# Patient Record
Sex: Female | Born: 1960 | Race: Black or African American | Hispanic: No | Marital: Single | Smoking: Former smoker
Health system: Southern US, Community
[De-identification: ages and names within clinical notes are randomized; demographics above are authoritative.]

## PROBLEM LIST (undated history)

## (undated) DIAGNOSIS — I10 Essential (primary) hypertension: Secondary | ICD-10-CM

## (undated) DIAGNOSIS — Z972 Presence of dental prosthetic device (complete) (partial): Secondary | ICD-10-CM

## (undated) DIAGNOSIS — N6022 Fibroadenosis of left breast: Secondary | ICD-10-CM

## (undated) DIAGNOSIS — Z98811 Dental restoration status: Secondary | ICD-10-CM

## (undated) DIAGNOSIS — J302 Other seasonal allergic rhinitis: Secondary | ICD-10-CM

## (undated) DIAGNOSIS — Z8639 Personal history of other endocrine, nutritional and metabolic disease: Secondary | ICD-10-CM

## (undated) HISTORY — PX: BREAST CYST EXCISION: SHX579

## (undated) HISTORY — PX: HYSTEROSCOPY WITH D & C: SHX1775

---

## 2000-01-07 ENCOUNTER — Ambulatory Visit: Admission: RE | Admit: 2000-01-07 | Discharge: 2000-01-07 | Payer: Self-pay | Admitting: Family Medicine

## 2000-01-24 ENCOUNTER — Encounter: Payer: Self-pay | Admitting: Family Medicine

## 2000-01-24 ENCOUNTER — Ambulatory Visit (HOSPITAL_COMMUNITY): Admission: RE | Admit: 2000-01-24 | Discharge: 2000-01-24 | Payer: Self-pay | Admitting: Family Medicine

## 2000-02-08 ENCOUNTER — Ambulatory Visit (HOSPITAL_COMMUNITY): Admission: RE | Admit: 2000-02-08 | Discharge: 2000-02-08 | Payer: Self-pay | Admitting: Cardiology

## 2000-02-08 HISTORY — PX: TRANSESOPHAGEAL ECHOCARDIOGRAM: SHX273

## 2001-07-28 ENCOUNTER — Other Ambulatory Visit: Admission: RE | Admit: 2001-07-28 | Discharge: 2001-07-28 | Payer: Self-pay | Admitting: Obstetrics and Gynecology

## 2001-08-18 ENCOUNTER — Encounter: Payer: Self-pay | Admitting: Obstetrics and Gynecology

## 2001-08-18 ENCOUNTER — Ambulatory Visit (HOSPITAL_COMMUNITY): Admission: RE | Admit: 2001-08-18 | Discharge: 2001-08-18 | Payer: Self-pay | Admitting: Obstetrics and Gynecology

## 2002-05-25 ENCOUNTER — Ambulatory Visit (HOSPITAL_COMMUNITY): Admission: RE | Admit: 2002-05-25 | Discharge: 2002-05-25 | Payer: Self-pay | Admitting: Endocrinology

## 2002-05-25 ENCOUNTER — Encounter: Payer: Self-pay | Admitting: Endocrinology

## 2002-08-02 ENCOUNTER — Other Ambulatory Visit: Admission: RE | Admit: 2002-08-02 | Discharge: 2002-08-02 | Payer: Self-pay | Admitting: Obstetrics and Gynecology

## 2002-08-19 ENCOUNTER — Encounter: Payer: Self-pay | Admitting: Obstetrics and Gynecology

## 2002-08-19 ENCOUNTER — Ambulatory Visit (HOSPITAL_COMMUNITY): Admission: RE | Admit: 2002-08-19 | Discharge: 2002-08-19 | Payer: Self-pay | Admitting: Obstetrics and Gynecology

## 2003-07-25 ENCOUNTER — Other Ambulatory Visit: Admission: RE | Admit: 2003-07-25 | Discharge: 2003-07-25 | Payer: Self-pay | Admitting: Obstetrics and Gynecology

## 2003-08-22 ENCOUNTER — Ambulatory Visit (HOSPITAL_COMMUNITY): Admission: RE | Admit: 2003-08-22 | Discharge: 2003-08-22 | Payer: Self-pay | Admitting: Obstetrics and Gynecology

## 2004-08-14 ENCOUNTER — Other Ambulatory Visit: Admission: RE | Admit: 2004-08-14 | Discharge: 2004-08-14 | Payer: Self-pay | Admitting: Obstetrics and Gynecology

## 2004-08-30 ENCOUNTER — Ambulatory Visit (HOSPITAL_COMMUNITY): Admission: RE | Admit: 2004-08-30 | Discharge: 2004-08-30 | Payer: Self-pay | Admitting: Obstetrics and Gynecology

## 2005-08-15 ENCOUNTER — Other Ambulatory Visit: Admission: RE | Admit: 2005-08-15 | Discharge: 2005-08-15 | Payer: Self-pay | Admitting: Obstetrics and Gynecology

## 2005-09-12 ENCOUNTER — Ambulatory Visit (HOSPITAL_COMMUNITY): Admission: RE | Admit: 2005-09-12 | Discharge: 2005-09-12 | Payer: Self-pay | Admitting: Obstetrics and Gynecology

## 2006-09-15 ENCOUNTER — Ambulatory Visit (HOSPITAL_COMMUNITY): Admission: RE | Admit: 2006-09-15 | Discharge: 2006-09-15 | Payer: Self-pay | Admitting: Obstetrics and Gynecology

## 2007-09-21 ENCOUNTER — Ambulatory Visit (HOSPITAL_COMMUNITY): Admission: RE | Admit: 2007-09-21 | Discharge: 2007-09-21 | Payer: Self-pay | Admitting: Obstetrics and Gynecology

## 2008-10-14 ENCOUNTER — Ambulatory Visit (HOSPITAL_COMMUNITY): Admission: RE | Admit: 2008-10-14 | Discharge: 2008-10-14 | Payer: Self-pay | Admitting: Obstetrics and Gynecology

## 2009-10-04 ENCOUNTER — Encounter: Admission: RE | Admit: 2009-10-04 | Discharge: 2009-10-04 | Payer: Self-pay | Admitting: Obstetrics and Gynecology

## 2010-10-04 ENCOUNTER — Other Ambulatory Visit: Payer: Self-pay | Admitting: Obstetrics and Gynecology

## 2010-10-04 DIAGNOSIS — D259 Leiomyoma of uterus, unspecified: Secondary | ICD-10-CM

## 2010-10-16 ENCOUNTER — Ambulatory Visit
Admission: RE | Admit: 2010-10-16 | Discharge: 2010-10-16 | Disposition: A | Discharge status: Home / Self Care | Payer: Managed Care, Other (non HMO) | Source: Ambulatory Visit | Attending: Obstetrics and Gynecology | Admitting: Obstetrics and Gynecology

## 2010-10-16 ENCOUNTER — Other Ambulatory Visit: Payer: Self-pay | Admitting: Obstetrics and Gynecology

## 2010-10-16 VITALS — BP 143/81 | HR 96 | Temp 98.5°F | Resp 14 | Ht 65.0 in | Wt 157.0 lb

## 2010-10-16 DIAGNOSIS — D219 Benign neoplasm of connective and other soft tissue, unspecified: Secondary | ICD-10-CM

## 2010-10-16 DIAGNOSIS — D259 Leiomyoma of uterus, unspecified: Secondary | ICD-10-CM

## 2010-10-16 HISTORY — DX: Essential (primary) hypertension: I10

## 2010-10-17 DIAGNOSIS — E0500 Thyrotoxicosis with diffuse goiter without thyrotoxic crisis or storm: Secondary | ICD-10-CM | POA: Insufficient documentation

## 2010-11-09 ENCOUNTER — Other Ambulatory Visit (HOSPITAL_COMMUNITY): Payer: Self-pay | Admitting: Obstetrics and Gynecology

## 2010-11-09 DIAGNOSIS — Z1231 Encounter for screening mammogram for malignant neoplasm of breast: Secondary | ICD-10-CM

## 2010-11-15 ENCOUNTER — Other Ambulatory Visit: Payer: Self-pay | Admitting: Obstetrics and Gynecology

## 2010-11-15 DIAGNOSIS — D219 Benign neoplasm of connective and other soft tissue, unspecified: Secondary | ICD-10-CM

## 2010-11-19 ENCOUNTER — Ambulatory Visit (HOSPITAL_COMMUNITY)
Admission: RE | Admit: 2010-11-19 | Discharge: 2010-11-19 | Disposition: A | Discharge status: Home / Self Care | Payer: Managed Care, Other (non HMO) | Source: Ambulatory Visit | Attending: Obstetrics and Gynecology | Admitting: Obstetrics and Gynecology

## 2010-11-19 DIAGNOSIS — Z1231 Encounter for screening mammogram for malignant neoplasm of breast: Secondary | ICD-10-CM

## 2010-11-20 ENCOUNTER — Ambulatory Visit
Admission: RE | Admit: 2010-11-20 | Discharge: 2010-11-20 | Disposition: A | Discharge status: Home / Self Care | Payer: Managed Care, Other (non HMO) | Source: Ambulatory Visit | Attending: Obstetrics and Gynecology | Admitting: Obstetrics and Gynecology

## 2010-11-20 DIAGNOSIS — D219 Benign neoplasm of connective and other soft tissue, unspecified: Secondary | ICD-10-CM

## 2010-11-20 MED ORDER — GADOBENATE DIMEGLUMINE 529 MG/ML IV SOLN
14.0000 mL | Freq: Once | INTRAVENOUS | Status: AC | PRN
  Administered 2010-11-20: 14 mL via INTRAVENOUS

## 2011-01-18 NOTE — Op Note (Signed)
[facility]  Patient:    Emma Velasquez, Emma Velasquez                         MRN: [id number] Proc. Date: 02/08/00 Adm. Date:  [id number] Disc. Date: [id number] Attending:  Corliss Marcus CC:         Dario Guardian, M.D.             Echocardiography laboratory             Fritzi Mandes, M.D.                           Operative Report  PROCEDURE:  Transesophageal echocardiography.  REASON FOR STUDY:  Ms. Emma Velasquez is a 50 year old woman who was referred to my  office for evaluation of heart murmur and transthoracic echocardiographic evidence of possible atrioseptal defect.  She did have physical examination findings to suggest right ventricular overload.  However, she was also found to be hyperthyroid by subsequent chemical testing.  She is now on Toprol XL 100 mg p.o. q.d. and awaits her evaluation by Fritzi Mandes, M.D. and subsequent treatment. She is therefore brought to the endoscopy suite for transesophageal echocardiography to confirm the presence of a significant ASD.  DESCRIPTION OF PROCEDURE:  Following topical pharyngeal anesthesia using 20% Benzocaine and the IV administration of a total of 4 mg of Versed and 75 mcg of  Fentanyl, the probe was introduced atraumatically.  Excellent transesophageal images were obtained.  Heart rate, blood pressure, O2 saturation, and ECG were monitored throughout and remained stable.  FINDINGS:  Essentially the study is normal.  All four chamber sizes are normal.  There is normal morphology of all four cardiac valves which were well visualized. There is mild mitral reguritation by color flow Doppler.  Left and right ventricular systolic function is normal.  The interatrial septum appears intact by 2-D imaging.  There was no shunt flow eft to right by color flow analysis.  An agitated saline intravenous contrast study was obtained and showed good opacification of the right heart chambers.  There was no  evidence of right to left shunt.  There was no negative contrast in the right atrium from the septum.  The aorta is normal size and without disease.  FINAL IMPRESSION: 1. Normal transesophageal echocardiogram. 2. No evidence of interatrial septal defect - PFO - shunt.  PLAN:  The patient is presented with this gratifying news.  She will remain on eta blocker while Fritzi Mandes, M.D. is undertaking her treatment and treatment for hyperthyroidism.  We will see her in the office again in two to four weeks. DD:  02/08/00 TD:  02/12/00 Job: [id number]

## 2011-02-13 ENCOUNTER — Other Ambulatory Visit (HOSPITAL_COMMUNITY): Payer: Self-pay | Admitting: Interventional Radiology

## 2011-02-13 DIAGNOSIS — D219 Benign neoplasm of connective and other soft tissue, unspecified: Secondary | ICD-10-CM

## 2011-03-11 ENCOUNTER — Ambulatory Visit (HOSPITAL_COMMUNITY)
Admission: RE | Admit: 2011-03-11 | Discharge: 2011-03-11 | Disposition: A | Discharge status: Home / Self Care | Payer: Managed Care, Other (non HMO) | Source: Ambulatory Visit | Attending: Interventional Radiology | Admitting: Interventional Radiology

## 2011-03-11 DIAGNOSIS — D259 Leiomyoma of uterus, unspecified: Secondary | ICD-10-CM | POA: Insufficient documentation

## 2011-03-11 LAB — CBC
MCHC: 32.4 g/dL (ref 30.0–36.0)
MCV: 78.9 fL (ref 78.0–100.0)
Platelets: 283 10*3/uL (ref 150–400)
RBC: 5.12 MIL/uL — ABNORMAL HIGH (ref 3.87–5.11)
RDW: 13.6 % (ref 11.5–15.5)
WBC: 5.4 10*3/uL (ref 4.0–10.5)

## 2011-03-11 LAB — CREATININE, SERUM: Creatinine, Ser: 0.8 mg/dL (ref 0.50–1.10)

## 2011-03-12 ENCOUNTER — Ambulatory Visit (HOSPITAL_COMMUNITY)
Admission: RE | Admit: 2011-03-12 | Discharge: 2011-03-12 | Disposition: A | Discharge status: Home / Self Care | Payer: Managed Care, Other (non HMO) | Source: Ambulatory Visit | Attending: Interventional Radiology | Admitting: Interventional Radiology

## 2011-03-12 DIAGNOSIS — D219 Benign neoplasm of connective and other soft tissue, unspecified: Secondary | ICD-10-CM

## 2011-03-27 ENCOUNTER — Other Ambulatory Visit (HOSPITAL_COMMUNITY): Payer: Self-pay | Admitting: Interventional Radiology

## 2011-03-27 DIAGNOSIS — D219 Benign neoplasm of connective and other soft tissue, unspecified: Secondary | ICD-10-CM

## 2011-04-11 ENCOUNTER — Other Ambulatory Visit (HOSPITAL_COMMUNITY): Payer: Self-pay | Admitting: Interventional Radiology

## 2011-04-11 ENCOUNTER — Observation Stay (HOSPITAL_COMMUNITY)
Admission: RE | Admit: 2011-04-11 | Discharge: 2011-04-12 | Disposition: A | Discharge status: Home / Self Care | Payer: Managed Care, Other (non HMO) | Source: Ambulatory Visit | Attending: Interventional Radiology | Admitting: Interventional Radiology

## 2011-04-11 DIAGNOSIS — D251 Intramural leiomyoma of uterus: Secondary | ICD-10-CM

## 2011-04-11 DIAGNOSIS — N920 Excessive and frequent menstruation with regular cycle: Secondary | ICD-10-CM | POA: Insufficient documentation

## 2011-04-11 DIAGNOSIS — D259 Leiomyoma of uterus, unspecified: Principal | ICD-10-CM | POA: Insufficient documentation

## 2011-04-11 DIAGNOSIS — I10 Essential (primary) hypertension: Secondary | ICD-10-CM | POA: Insufficient documentation

## 2011-04-11 DIAGNOSIS — R112 Nausea with vomiting, unspecified: Secondary | ICD-10-CM | POA: Insufficient documentation

## 2011-04-11 DIAGNOSIS — D219 Benign neoplasm of connective and other soft tissue, unspecified: Secondary | ICD-10-CM

## 2011-04-11 LAB — BASIC METABOLIC PANEL
BUN: 12 mg/dL (ref 6–23)
Calcium: 9.9 mg/dL (ref 8.4–10.5)
Chloride: 104 mEq/L (ref 96–112)
GFR calc Af Amer: >60 mL/min (ref >60)
Potassium: 3.4 mEq/L — ABNORMAL LOW (ref 3.5–5.1)
Sodium: 138 mEq/L (ref 135–145)

## 2011-04-11 LAB — CBC
HCT: 40.3 % (ref 36.0–46.0)
MCHC: 33.5 g/dL (ref 30.0–36.0)
Platelets: 268 10*3/uL (ref 150–400)
RDW: 14 % (ref 11.5–15.5)

## 2011-04-11 LAB — HCG, SERUM, QUALITATIVE: Preg, Serum: NEGATIVE

## 2011-04-11 MED ORDER — IOHEXOL 300 MG/ML  SOLN
150.0000 mL | Freq: Once | INTRAMUSCULAR | Status: AC | PRN
  Administered 2011-04-11: 99 mL via INTRA_ARTERIAL

## 2011-04-16 ENCOUNTER — Other Ambulatory Visit: Payer: Self-pay | Admitting: Interventional Radiology

## 2011-04-16 DIAGNOSIS — Z09 Encounter for follow-up examination after completed treatment for conditions other than malignant neoplasm: Secondary | ICD-10-CM

## 2011-04-23 ENCOUNTER — Ambulatory Visit
Admission: RE | Admit: 2011-04-23 | Discharge: 2011-04-23 | Disposition: A | Discharge status: Home / Self Care | Payer: Managed Care, Other (non HMO) | Source: Ambulatory Visit | Attending: Interventional Radiology | Admitting: Interventional Radiology

## 2011-04-23 VITALS — BP 131/85 | HR 83 | Temp 98.3°F | Resp 14

## 2011-04-23 DIAGNOSIS — Z09 Encounter for follow-up examination after completed treatment for conditions other than malignant neoplasm: Secondary | ICD-10-CM

## 2011-04-23 NOTE — Progress Notes (Signed)
Pt doing well post [place].  Occasional spotting, no foul odor.  Occasional mild cramping.    Appetite improving.  Energy improving.  Next Depo injection scheduled for September at [facility] office.

## 2011-07-09 ENCOUNTER — Telehealth: Payer: Self-pay | Admitting: Radiology

## 2011-07-09 NOTE — Telephone Encounter (Signed)
3 mo f/u [place] phone call.    Pt doing well.  Dec'd breakthrough bleeding (only occasionally post [place]).  Pt states that she is doing well & prefers to wait for 6 mo f/u to schedule app't for follow up office visit w/ Dr Bonnielee Haff.

## 2011-09-05 ENCOUNTER — Other Ambulatory Visit: Payer: Self-pay | Admitting: Interventional Radiology

## 2011-09-05 DIAGNOSIS — D219 Benign neoplasm of connective and other soft tissue, unspecified: Secondary | ICD-10-CM

## 2011-10-09 ENCOUNTER — Ambulatory Visit
Admission: RE | Admit: 2011-10-09 | Discharge: 2011-10-09 | Disposition: A | Discharge status: Home / Self Care | Payer: Managed Care, Other (non HMO) | Source: Ambulatory Visit | Attending: Interventional Radiology | Admitting: Interventional Radiology

## 2011-10-09 ENCOUNTER — Inpatient Hospital Stay: Admission: RE | Admit: 2011-10-09 | Payer: Managed Care, Other (non HMO) | Source: Ambulatory Visit

## 2011-10-09 DIAGNOSIS — D219 Benign neoplasm of connective and other soft tissue, unspecified: Secondary | ICD-10-CM

## 2011-10-09 NOTE — Progress Notes (Signed)
Last Depo injection on 05/14/2011.    Pt reports that she had a very light menstrual cycles mid December 2012 & mid January 2013.   Denies heavy flow or clots.  States that she is doing well overall, no complaints.

## 2011-11-11 ENCOUNTER — Telehealth: Payer: Self-pay | Admitting: Emergency Medicine

## 2011-11-11 NOTE — Telephone Encounter (Signed)
 POST [place] BY DR HOSS, HAD C/C WATERY/PINKISH D/C. HAD SOME TISSUE D/C LAST WK. IS THIS NORMAL?  NO FEVER OR FOWL SMELL.  11:41AM- PAGED KEVIN, PA-C, KEVIN TO CONTACT PT.

## 2011-11-13 ENCOUNTER — Telehealth: Payer: Self-pay | Admitting: Emergency Medicine

## 2011-11-13 ENCOUNTER — Other Ambulatory Visit (HOSPITAL_COMMUNITY): Payer: Self-pay | Admitting: Interventional Radiology

## 2011-11-13 DIAGNOSIS — N898 Other specified noninflammatory disorders of vagina: Secondary | ICD-10-CM

## 2011-11-13 NOTE — Telephone Encounter (Signed)
PT D/C NOT BETTER FROM 2 DAYS AGO.  SAID KEVIN, PA-C WOULD CALL IN A RX IF NEEDED  9:59AM- PAGED KEVIN, PA-C- 1003AM- KEVIN WILL CONTACT PT.

## 2011-11-14 ENCOUNTER — Ambulatory Visit (HOSPITAL_COMMUNITY)
Admission: RE | Admit: 2011-11-14 | Discharge: 2011-11-14 | Disposition: A | Discharge status: Home / Self Care | Payer: Managed Care, Other (non HMO) | Source: Ambulatory Visit | Attending: Interventional Radiology | Admitting: Interventional Radiology

## 2011-11-14 ENCOUNTER — Other Ambulatory Visit (HOSPITAL_COMMUNITY): Payer: Self-pay | Admitting: Interventional Radiology

## 2011-11-14 DIAGNOSIS — N898 Other specified noninflammatory disorders of vagina: Secondary | ICD-10-CM | POA: Insufficient documentation

## 2011-11-14 DIAGNOSIS — D259 Leiomyoma of uterus, unspecified: Secondary | ICD-10-CM | POA: Insufficient documentation

## 2011-11-14 DIAGNOSIS — D219 Benign neoplasm of connective and other soft tissue, unspecified: Secondary | ICD-10-CM

## 2011-11-14 DIAGNOSIS — Z9889 Other specified postprocedural states: Secondary | ICD-10-CM | POA: Insufficient documentation

## 2011-11-21 ENCOUNTER — Encounter (INDEPENDENT_AMBULATORY_CARE_PROVIDER_SITE_OTHER): Payer: Managed Care, Other (non HMO) | Admitting: Obstetrics and Gynecology

## 2011-11-21 DIAGNOSIS — R319 Hematuria, unspecified: Secondary | ICD-10-CM

## 2011-11-21 DIAGNOSIS — N926 Irregular menstruation, unspecified: Secondary | ICD-10-CM

## 2011-11-21 DIAGNOSIS — N949 Unspecified condition associated with female genital organs and menstrual cycle: Secondary | ICD-10-CM

## 2011-11-21 DIAGNOSIS — E039 Hypothyroidism, unspecified: Secondary | ICD-10-CM

## 2011-12-18 ENCOUNTER — Other Ambulatory Visit: Payer: Self-pay | Admitting: Obstetrics and Gynecology

## 2011-12-18 DIAGNOSIS — Z1231 Encounter for screening mammogram for malignant neoplasm of breast: Secondary | ICD-10-CM

## 2011-12-30 ENCOUNTER — Ambulatory Visit (HOSPITAL_COMMUNITY)
Admission: RE | Admit: 2011-12-30 | Discharge: 2011-12-30 | Disposition: A | Discharge status: Home / Self Care | Payer: Managed Care, Other (non HMO) | Source: Ambulatory Visit | Attending: Obstetrics and Gynecology | Admitting: Obstetrics and Gynecology

## 2011-12-30 DIAGNOSIS — Z1231 Encounter for screening mammogram for malignant neoplasm of breast: Secondary | ICD-10-CM | POA: Insufficient documentation

## 2012-02-17 ENCOUNTER — Ambulatory Visit: Payer: Managed Care, Other (non HMO)

## 2012-02-17 ENCOUNTER — Ambulatory Visit: Payer: Managed Care, Other (non HMO) | Admitting: Family Medicine

## 2012-02-17 VITALS — BP 118/77 | HR 77 | Temp 98.1°F | Resp 12 | Ht 66.0 in | Wt 168.0 lb

## 2012-02-17 DIAGNOSIS — M79646 Pain in unspecified finger(s): Secondary | ICD-10-CM

## 2012-02-17 DIAGNOSIS — M79609 Pain in unspecified limb: Secondary | ICD-10-CM

## 2012-02-17 DIAGNOSIS — M20019 Mallet finger of unspecified finger(s): Secondary | ICD-10-CM

## 2012-02-17 NOTE — Progress Notes (Signed)
  Subjective:    Patient ID: Emma Velasquez, female    DOB: [birth year], 51 y.o.   MRN: [id number]  HPI Patient presents with 3 day history of left pinky DIP immobility. States she was pulling out a broken drawer that slipped and fell down. She is unsure whether she was holding the drawer on the top or underneath, but after it slipped her left pinky DIP has "looked funny." She denies any pain on the affected joint but admits to decreased movement. She was wearing a splint that she got from the pharmacy but stopped wearing it due to it being too bulky.     Review of Systems  All other systems reviewed and are negative.       Objective:   Physical Exam  Constitutional: She is oriented to person, place, and time. She appears well-developed and well-nourished.  HENT:  Head: Normocephalic and atraumatic.  Right Ear: External ear normal.  Left Ear: External ear normal.  Eyes: Conjunctivae are normal.  Neck: Normal range of motion.  Cardiovascular: Normal rate, regular rhythm and normal heart sounds.   Pulmonary/Chest: Effort normal and breath sounds normal.  Abdominal: Soft. Bowel sounds are normal.  Musculoskeletal:       Left 5th DIP is in position of flexion. Patient unable to passively extend. No pain to palpation of the joint. 5/5 strength of flexor. Capillary refill <2 seconds. Unable to resist extension.   Neurological: She is alert and oriented to person, place, and time.  Psychiatric: She has a normal mood and affect. Her behavior is normal. Judgment and thought content normal.     [facility] reading (PRIMARY) by  Dr. Neva Seat as normal with no acute bony abnormalities.       Assessment & Plan:    1. Finger pain  DG Finger Little Left  2. Mallet finger  Left DIP splinted in position of extension. Discussed the need for continuous splinting x 6 weeks.  Refer to [facility] for evaluation and management of mallet finger.  Ambulatory referral to Physical Therapy

## 2012-02-18 NOTE — Progress Notes (Signed)
  Subjective:    Patient ID: Emma Velasquez, female    DOB: 12-10-1960, 51 y.o.   MRN: [id number]  HPI    Review of Systems     Objective:   Physical Exam        Assessment & Plan:  Xray read and patient discussed with Heather Marte,PA-C. Patient with inability to actively extend distal phalynx - Mallet finger.  [facility] for splinting protocol and formed splint. Agree with assessment and plan of care per Heather's note.

## 2013-03-11 ENCOUNTER — Other Ambulatory Visit: Payer: Self-pay | Admitting: Obstetrics and Gynecology

## 2013-03-11 DIAGNOSIS — Z1231 Encounter for screening mammogram for malignant neoplasm of breast: Secondary | ICD-10-CM

## 2013-03-16 ENCOUNTER — Ambulatory Visit (HOSPITAL_COMMUNITY)
Admission: RE | Admit: 2013-03-16 | Discharge: 2013-03-16 | Disposition: A | Discharge status: Home / Self Care | Payer: Managed Care, Other (non HMO) | Source: Ambulatory Visit | Attending: Obstetrics and Gynecology | Admitting: Obstetrics and Gynecology

## 2013-03-16 DIAGNOSIS — Z1231 Encounter for screening mammogram for malignant neoplasm of breast: Secondary | ICD-10-CM | POA: Insufficient documentation

## 2013-03-18 ENCOUNTER — Other Ambulatory Visit: Payer: Self-pay | Admitting: Obstetrics and Gynecology

## 2013-03-18 DIAGNOSIS — R928 Other abnormal and inconclusive findings on diagnostic imaging of breast: Secondary | ICD-10-CM

## 2013-03-31 ENCOUNTER — Ambulatory Visit
Admission: RE | Admit: 2013-03-31 | Discharge: 2013-03-31 | Disposition: A | Discharge status: Home / Self Care | Payer: Managed Care, Other (non HMO) | Source: Ambulatory Visit | Attending: Obstetrics and Gynecology | Admitting: Obstetrics and Gynecology

## 2013-03-31 DIAGNOSIS — R928 Other abnormal and inconclusive findings on diagnostic imaging of breast: Secondary | ICD-10-CM

## 2014-04-28 ENCOUNTER — Other Ambulatory Visit: Payer: Self-pay | Admitting: Obstetrics and Gynecology

## 2014-04-28 DIAGNOSIS — Z1231 Encounter for screening mammogram for malignant neoplasm of breast: Secondary | ICD-10-CM

## 2014-05-20 ENCOUNTER — Ambulatory Visit (HOSPITAL_COMMUNITY)
Admission: RE | Admit: 2014-05-20 | Discharge: 2014-05-20 | Disposition: A | Discharge status: Home / Self Care | Payer: Medicare HMO | Source: Ambulatory Visit | Attending: Obstetrics and Gynecology | Admitting: Obstetrics and Gynecology

## 2014-05-20 ENCOUNTER — Ambulatory Visit (HOSPITAL_COMMUNITY): Payer: Managed Care, Other (non HMO)

## 2014-05-20 DIAGNOSIS — Z1231 Encounter for screening mammogram for malignant neoplasm of breast: Secondary | ICD-10-CM | POA: Insufficient documentation

## 2015-06-05 ENCOUNTER — Other Ambulatory Visit: Payer: Self-pay

## 2015-06-05 DIAGNOSIS — Z1231 Encounter for screening mammogram for malignant neoplasm of breast: Secondary | ICD-10-CM

## 2015-06-06 ENCOUNTER — Ambulatory Visit
Admission: RE | Admit: 2015-06-06 | Discharge: 2015-06-06 | Disposition: A | Discharge status: Home / Self Care | Payer: Managed Care, Other (non HMO) | Source: Ambulatory Visit

## 2015-06-06 DIAGNOSIS — Z1231 Encounter for screening mammogram for malignant neoplasm of breast: Secondary | ICD-10-CM

## 2016-06-06 ENCOUNTER — Other Ambulatory Visit: Payer: Self-pay | Admitting: Obstetrics and Gynecology

## 2016-06-06 DIAGNOSIS — Z1231 Encounter for screening mammogram for malignant neoplasm of breast: Secondary | ICD-10-CM

## 2016-06-12 ENCOUNTER — Ambulatory Visit
Admission: RE | Admit: 2016-06-12 | Discharge: 2016-06-12 | Disposition: A | Discharge status: Home / Self Care | Payer: Managed Care, Other (non HMO) | Source: Ambulatory Visit | Attending: Obstetrics and Gynecology | Admitting: Obstetrics and Gynecology

## 2016-06-12 DIAGNOSIS — Z1231 Encounter for screening mammogram for malignant neoplasm of breast: Secondary | ICD-10-CM

## 2017-09-02 HISTORY — PX: BREAST EXCISIONAL BIOPSY: SUR124

## 2018-01-14 ENCOUNTER — Other Ambulatory Visit: Payer: Self-pay | Admitting: Obstetrics and Gynecology

## 2018-01-14 DIAGNOSIS — Z1231 Encounter for screening mammogram for malignant neoplasm of breast: Secondary | ICD-10-CM

## 2018-02-04 ENCOUNTER — Ambulatory Visit
Admission: RE | Admit: 2018-02-04 | Discharge: 2018-02-04 | Disposition: A | Discharge status: Home / Self Care | Payer: 59 | Source: Ambulatory Visit | Attending: Obstetrics and Gynecology | Admitting: Obstetrics and Gynecology

## 2018-02-04 DIAGNOSIS — Z1231 Encounter for screening mammogram for malignant neoplasm of breast: Secondary | ICD-10-CM

## 2018-02-05 ENCOUNTER — Other Ambulatory Visit: Payer: Self-pay | Admitting: Obstetrics and Gynecology

## 2018-02-05 DIAGNOSIS — R928 Other abnormal and inconclusive findings on diagnostic imaging of breast: Secondary | ICD-10-CM

## 2018-02-09 ENCOUNTER — Other Ambulatory Visit: Payer: 59

## 2018-02-10 ENCOUNTER — Ambulatory Visit
Admission: RE | Admit: 2018-02-10 | Discharge: 2018-02-10 | Disposition: A | Discharge status: Home / Self Care | Payer: 59 | Source: Ambulatory Visit | Attending: Obstetrics and Gynecology | Admitting: Obstetrics and Gynecology

## 2018-02-10 ENCOUNTER — Other Ambulatory Visit: Payer: Self-pay | Admitting: Obstetrics and Gynecology

## 2018-02-10 DIAGNOSIS — R928 Other abnormal and inconclusive findings on diagnostic imaging of breast: Secondary | ICD-10-CM

## 2018-02-10 DIAGNOSIS — N6489 Other specified disorders of breast: Secondary | ICD-10-CM

## 2018-02-11 ENCOUNTER — Other Ambulatory Visit: Payer: Self-pay | Admitting: Obstetrics and Gynecology

## 2018-02-11 ENCOUNTER — Ambulatory Visit
Admission: RE | Admit: 2018-02-11 | Discharge: 2018-02-11 | Disposition: A | Discharge status: Home / Self Care | Payer: 59 | Source: Ambulatory Visit | Attending: Obstetrics and Gynecology | Admitting: Obstetrics and Gynecology

## 2018-02-11 DIAGNOSIS — N6489 Other specified disorders of breast: Secondary | ICD-10-CM

## 2018-03-09 ENCOUNTER — Other Ambulatory Visit: Payer: Self-pay | Admitting: General Surgery

## 2018-03-09 ENCOUNTER — Ambulatory Visit: Payer: Self-pay | Admitting: General Surgery

## 2018-03-09 DIAGNOSIS — N6022 Fibroadenosis of left breast: Secondary | ICD-10-CM

## 2018-04-02 DIAGNOSIS — N6022 Fibroadenosis of left breast: Secondary | ICD-10-CM

## 2018-04-02 HISTORY — DX: Fibroadenosis of left breast: N60.22

## 2018-04-09 ENCOUNTER — Encounter (HOSPITAL_BASED_OUTPATIENT_CLINIC_OR_DEPARTMENT_OTHER): Payer: Self-pay | Admitting: *Deleted

## 2018-04-09 ENCOUNTER — Other Ambulatory Visit: Payer: Self-pay

## 2018-04-09 NOTE — Pre-Procedure Instructions (Signed)
To come for BMET and EKG; to pick up Ensure pre-surgery drink 10 oz. - to drink by 0500 DOS.

## 2018-04-13 ENCOUNTER — Encounter (HOSPITAL_BASED_OUTPATIENT_CLINIC_OR_DEPARTMENT_OTHER)
Admission: RE | Admit: 2018-04-13 | Discharge: 2018-04-13 | Disposition: A | Discharge status: Home / Self Care | Payer: 59 | Source: Ambulatory Visit | Attending: General Surgery | Admitting: General Surgery

## 2018-04-13 DIAGNOSIS — Z87891 Personal history of nicotine dependence: Secondary | ICD-10-CM | POA: Diagnosis not present

## 2018-04-13 DIAGNOSIS — N6022 Fibroadenosis of left breast: Secondary | ICD-10-CM | POA: Diagnosis not present

## 2018-04-13 DIAGNOSIS — E7800 Pure hypercholesterolemia, unspecified: Secondary | ICD-10-CM | POA: Diagnosis not present

## 2018-04-13 DIAGNOSIS — Z79899 Other long term (current) drug therapy: Secondary | ICD-10-CM | POA: Diagnosis not present

## 2018-04-13 DIAGNOSIS — E0590 Thyrotoxicosis, unspecified without thyrotoxic crisis or storm: Secondary | ICD-10-CM | POA: Diagnosis not present

## 2018-04-13 DIAGNOSIS — D242 Benign neoplasm of left breast: Secondary | ICD-10-CM | POA: Diagnosis not present

## 2018-04-13 DIAGNOSIS — I10 Essential (primary) hypertension: Secondary | ICD-10-CM | POA: Diagnosis not present

## 2018-04-13 LAB — BASIC METABOLIC PANEL
ANION GAP: 10 (ref 5–15)
BUN: 13 mg/dL (ref 6–20)
CALCIUM: 9.7 mg/dL (ref 8.9–10.3)
CO2: 27 mmol/L (ref 22–32)
Chloride: 101 mmol/L (ref 98–111)
Creatinine, Ser: 0.87 mg/dL (ref 0.44–1.00)
GFR calc Af Amer: >60 mL/min (ref >60)
GFR calc non Af Amer: >60 mL/min (ref >60)
GLUCOSE: 106 mg/dL — AB (ref 70–99)
Potassium: 4 mmol/L (ref 3.5–5.1)
Sodium: 138 mmol/L (ref 135–145)

## 2018-04-13 NOTE — Progress Notes (Addendum)
Ensure pre surgery drink given with instructions to complete by 0500 dos, pt verbalized understanding.  EKG reviewed by Dr. Nyoka Cowden, will proceed with surgery as scheduled.

## 2018-04-14 ENCOUNTER — Ambulatory Visit
Admission: RE | Admit: 2018-04-14 | Discharge: 2018-04-14 | Disposition: A | Discharge status: Home / Self Care | Payer: 59 | Source: Ambulatory Visit | Attending: General Surgery | Admitting: General Surgery

## 2018-04-14 DIAGNOSIS — N6022 Fibroadenosis of left breast: Secondary | ICD-10-CM

## 2018-04-14 NOTE — Anesthesia Preprocedure Evaluation (Addendum)
Anesthesia Evaluation    Reviewed: Allergy & Precautions, NPO status , Patient's Chart, lab work & pertinent test results  Airway Mallampati: I  TM Distance: >3 FB Neck ROM: Full    Dental no notable dental hx. (+) Teeth Intact, Dental Advisory Given   Pulmonary former smoker,    Pulmonary exam normal breath sounds clear to auscultation       Cardiovascular hypertension, Pt. on medications Normal cardiovascular exam Rhythm:Regular Rate:Normal  ECG: Sinus rhythm with 1st degree A-V block   Neuro/Psych negative neurological ROS  negative psych ROS   GI/Hepatic negative GI ROS, Neg liver ROS,   Endo/Other  Hyperthyroidism   Renal/GU negative Renal ROS     Musculoskeletal negative musculoskeletal ROS (+)   Abdominal   Peds  Hematology HLD   Anesthesia Other Findings  LEFT BREAST SCLEROSING ADENOSIS  Reproductive/Obstetrics                            Anesthesia Physical Anesthesia Plan  ASA: II  Anesthesia Plan: General   Post-op Pain Management:    Induction: Intravenous  PONV Risk Score and Plan: 3 and Ondansetron, Dexamethasone, Midazolam, Scopolamine patch - Pre-op and Treatment may vary due to age or medical condition  Airway Management Planned: LMA  Additional Equipment:   Intra-op Plan:   Post-operative Plan: Extubation in OR  Informed Consent: I have reviewed the patients History and Physical, chart, labs and discussed the procedure including the risks, benefits and alternatives for the proposed anesthesia with the patient or authorized representative who has indicated his/her understanding and acceptance.   Dental advisory given  Plan Discussed with:   Anesthesia Plan Comments:        Anesthesia Quick Evaluation

## 2018-04-15 ENCOUNTER — Encounter (HOSPITAL_BASED_OUTPATIENT_CLINIC_OR_DEPARTMENT_OTHER)
Admission: RE | Disposition: A | Discharge status: Home / Self Care | Payer: Self-pay | Source: Ambulatory Visit | Attending: General Surgery

## 2018-04-15 ENCOUNTER — Ambulatory Visit
Admission: RE | Admit: 2018-04-15 | Discharge: 2018-04-15 | Disposition: A | Discharge status: Home / Self Care | Payer: 59 | Source: Ambulatory Visit | Attending: General Surgery | Admitting: General Surgery

## 2018-04-15 ENCOUNTER — Encounter (HOSPITAL_BASED_OUTPATIENT_CLINIC_OR_DEPARTMENT_OTHER): Payer: Self-pay | Admitting: Certified Registered"

## 2018-04-15 ENCOUNTER — Ambulatory Visit (HOSPITAL_BASED_OUTPATIENT_CLINIC_OR_DEPARTMENT_OTHER)
Admission: RE | Admit: 2018-04-15 | Discharge: 2018-04-15 | Disposition: A | Discharge status: Home / Self Care | Payer: 59 | Source: Ambulatory Visit | Attending: General Surgery | Admitting: General Surgery

## 2018-04-15 ENCOUNTER — Other Ambulatory Visit: Payer: Self-pay

## 2018-04-15 ENCOUNTER — Ambulatory Visit (HOSPITAL_BASED_OUTPATIENT_CLINIC_OR_DEPARTMENT_OTHER): Payer: 59 | Admitting: Anesthesiology

## 2018-04-15 DIAGNOSIS — N6022 Fibroadenosis of left breast: Secondary | ICD-10-CM | POA: Insufficient documentation

## 2018-04-15 DIAGNOSIS — E7800 Pure hypercholesterolemia, unspecified: Secondary | ICD-10-CM | POA: Insufficient documentation

## 2018-04-15 DIAGNOSIS — D242 Benign neoplasm of left breast: Secondary | ICD-10-CM | POA: Insufficient documentation

## 2018-04-15 DIAGNOSIS — Z87891 Personal history of nicotine dependence: Secondary | ICD-10-CM | POA: Insufficient documentation

## 2018-04-15 DIAGNOSIS — E0590 Thyrotoxicosis, unspecified without thyrotoxic crisis or storm: Secondary | ICD-10-CM | POA: Insufficient documentation

## 2018-04-15 DIAGNOSIS — I10 Essential (primary) hypertension: Secondary | ICD-10-CM | POA: Insufficient documentation

## 2018-04-15 DIAGNOSIS — Z79899 Other long term (current) drug therapy: Secondary | ICD-10-CM | POA: Insufficient documentation

## 2018-04-15 HISTORY — DX: Fibroadenosis of left breast: N60.22

## 2018-04-15 HISTORY — DX: Other seasonal allergic rhinitis: J30.2

## 2018-04-15 HISTORY — DX: Presence of dental prosthetic device (complete) (partial): Z97.2

## 2018-04-15 HISTORY — DX: Dental restoration status: Z98.811

## 2018-04-15 HISTORY — PX: BREAST LUMPECTOMY WITH RADIOACTIVE SEED LOCALIZATION: SHX6424

## 2018-04-15 HISTORY — DX: Personal history of other endocrine, nutritional and metabolic disease: Z86.39

## 2018-04-15 SURGERY — BREAST LUMPECTOMY WITH RADIOACTIVE SEED LOCALIZATION
Anesthesia: General | Site: Breast | Laterality: Left

## 2018-04-15 MED ORDER — PROMETHAZINE HCL 25 MG/ML IJ SOLN: 6.2500 mg | INTRAMUSCULAR | Status: DC | PRN

## 2018-04-15 MED ORDER — HYDROCODONE-ACETAMINOPHEN 5-325 MG PO TABS: 1.0000 | ORAL_TABLET | Freq: Four times a day (QID) | ORAL | 0 refills | Status: AC | PRN

## 2018-04-15 MED ORDER — GABAPENTIN 300 MG PO CAPS
300.0000 mg | ORAL_CAPSULE | ORAL | Status: AC
  Administered 2018-04-15: 300 mg via ORAL

## 2018-04-15 MED ORDER — ONDANSETRON HCL 4 MG/2ML IJ SOLN
INTRAMUSCULAR | Status: DC | PRN
  Administered 2018-04-15: 4 mg via INTRAVENOUS

## 2018-04-15 MED ORDER — MIDAZOLAM HCL 2 MG/2ML IJ SOLN
1.0000 mg | INTRAMUSCULAR | Status: DC | PRN
  Administered 2018-04-15: 2 mg via INTRAVENOUS

## 2018-04-15 MED ORDER — MIDAZOLAM HCL 2 MG/2ML IJ SOLN
INTRAMUSCULAR | Status: AC
  Filled 2018-04-15: qty 2

## 2018-04-15 MED ORDER — HYDROMORPHONE HCL 1 MG/ML IJ SOLN: 0.2500 mg | INTRAMUSCULAR | Status: DC | PRN

## 2018-04-15 MED ORDER — OXYCODONE HCL 5 MG/5ML PO SOLN: 5.0000 mg | Freq: Once | ORAL | Status: AC | PRN

## 2018-04-15 MED ORDER — OXYCODONE HCL 5 MG PO TABS
ORAL_TABLET | ORAL | Status: AC
  Filled 2018-04-15: qty 1

## 2018-04-15 MED ORDER — GABAPENTIN 300 MG PO CAPS
ORAL_CAPSULE | ORAL | Status: AC
  Filled 2018-04-15: qty 1

## 2018-04-15 MED ORDER — CELECOXIB 200 MG PO CAPS
200.0000 mg | ORAL_CAPSULE | ORAL | Status: AC
  Administered 2018-04-15: 200 mg via ORAL

## 2018-04-15 MED ORDER — CEFAZOLIN SODIUM-DEXTROSE 2-4 GM/100ML-% IV SOLN
2.0000 g | INTRAVENOUS | Status: AC
  Administered 2018-04-15: 2 g via INTRAVENOUS

## 2018-04-15 MED ORDER — OXYCODONE HCL 5 MG PO TABS
5.0000 mg | ORAL_TABLET | Freq: Once | ORAL | Status: AC | PRN
  Administered 2018-04-15: 5 mg via ORAL

## 2018-04-15 MED ORDER — EPHEDRINE SULFATE 50 MG/ML IJ SOLN
INTRAMUSCULAR | Status: DC | PRN
  Administered 2018-04-15: 10 mg via INTRAVENOUS

## 2018-04-15 MED ORDER — FENTANYL CITRATE (PF) 100 MCG/2ML IJ SOLN
INTRAMUSCULAR | Status: AC
  Filled 2018-04-15: qty 2

## 2018-04-15 MED ORDER — SCOPOLAMINE 1 MG/3DAYS TD PT72: 1.0000 | MEDICATED_PATCH | Freq: Once | TRANSDERMAL | Status: DC | PRN

## 2018-04-15 MED ORDER — CEFAZOLIN SODIUM-DEXTROSE 2-4 GM/100ML-% IV SOLN
INTRAVENOUS | Status: AC
  Filled 2018-04-15: qty 100

## 2018-04-15 MED ORDER — PROPOFOL 10 MG/ML IV BOLUS
INTRAVENOUS | Status: DC | PRN
  Administered 2018-04-15: 120 mg via INTRAVENOUS

## 2018-04-15 MED ORDER — FENTANYL CITRATE (PF) 100 MCG/2ML IJ SOLN
50.0000 ug | INTRAMUSCULAR | Status: DC | PRN
  Administered 2018-04-15: 50 ug via INTRAVENOUS

## 2018-04-15 MED ORDER — LACTATED RINGERS IV SOLN
INTRAVENOUS | Status: DC
  Administered 2018-04-15: 08:00:00 via INTRAVENOUS

## 2018-04-15 MED ORDER — DEXAMETHASONE SODIUM PHOSPHATE 4 MG/ML IJ SOLN
INTRAMUSCULAR | Status: DC | PRN
  Administered 2018-04-15: 10 mg via INTRAVENOUS

## 2018-04-15 MED ORDER — CELECOXIB 200 MG PO CAPS
ORAL_CAPSULE | ORAL | Status: AC
  Filled 2018-04-15: qty 1

## 2018-04-15 MED ORDER — CHLORHEXIDINE GLUCONATE CLOTH 2 % EX PADS: 6.0000 | MEDICATED_PAD | Freq: Once | CUTANEOUS | Status: DC

## 2018-04-15 MED ORDER — LIDOCAINE HCL (CARDIAC) PF 100 MG/5ML IV SOSY
PREFILLED_SYRINGE | INTRAVENOUS | Status: DC | PRN
  Administered 2018-04-15: 60 mg via INTRAVENOUS

## 2018-04-15 MED ORDER — BUPIVACAINE HCL (PF) 0.25 % IJ SOLN
INTRAMUSCULAR | Status: DC | PRN
  Administered 2018-04-15: 10 mL

## 2018-04-15 MED ORDER — ACETAMINOPHEN 500 MG PO TABS
ORAL_TABLET | ORAL | Status: AC
  Filled 2018-04-15: qty 2

## 2018-04-15 MED ORDER — ACETAMINOPHEN 500 MG PO TABS
1000.0000 mg | ORAL_TABLET | ORAL | Status: AC
  Administered 2018-04-15: 1000 mg via ORAL

## 2018-04-15 SURGICAL SUPPLY — 46 items
ADH SKN CLS APL DERMABOND .7 (GAUZE/BANDAGES/DRESSINGS) ×1
APPLIER CLIP 9.375 MED OPEN (MISCELLANEOUS)
APR CLP MED 9.3 20 MLT OPN (MISCELLANEOUS)
BINDER BREAST LRG (GAUZE/BANDAGES/DRESSINGS) ×1 IMPLANT
BLADE SURG 15 STRL LF DISP TIS (BLADE) ×1 IMPLANT
BLADE SURG 15 STRL SS (BLADE) ×2
CANISTER SUC SOCK COL 7IN (MISCELLANEOUS) ×1 IMPLANT
CANISTER SUCT 1200ML W/VALVE (MISCELLANEOUS) ×2 IMPLANT
CHLORAPREP W/TINT 26ML (MISCELLANEOUS) ×2 IMPLANT
CLIP APPLIE 9.375 MED OPEN (MISCELLANEOUS) IMPLANT
COVER BACK TABLE 60X90IN (DRAPES) ×2 IMPLANT
COVER MAYO STAND STRL (DRAPES) ×2 IMPLANT
COVER PROBE W GEL 5X96 (DRAPES) ×2 IMPLANT
DECANTER SPIKE VIAL GLASS SM (MISCELLANEOUS) IMPLANT
DERMABOND ADVANCED (GAUZE/BANDAGES/DRESSINGS) ×1
DERMABOND ADVANCED .7 DNX12 (GAUZE/BANDAGES/DRESSINGS) ×1 IMPLANT
DEVICE DUBIN W/COMP PLATE 8390 (MISCELLANEOUS) ×2 IMPLANT
DRAPE LAPAROSCOPIC ABDOMINAL (DRAPES) ×2 IMPLANT
DRAPE UTILITY XL STRL (DRAPES) ×2 IMPLANT
ELECT COATED BLADE 2.86 ST (ELECTRODE) ×2 IMPLANT
ELECT REM PT RETURN 9FT ADLT (ELECTROSURGICAL) ×2
ELECTRODE REM PT RTRN 9FT ADLT (ELECTROSURGICAL) ×1 IMPLANT
GLOVE BIO SURGEON STRL SZ7 (GLOVE) ×1 IMPLANT
GLOVE BIO SURGEON STRL SZ7.5 (GLOVE) ×4 IMPLANT
GOWN STRL REUS W/ TWL LRG LVL3 (GOWN DISPOSABLE) ×2 IMPLANT
GOWN STRL REUS W/ TWL XL LVL3 (GOWN DISPOSABLE) IMPLANT
GOWN STRL REUS W/TWL LRG LVL3 (GOWN DISPOSABLE) ×2
GOWN STRL REUS W/TWL XL LVL3 (GOWN DISPOSABLE) ×2
ILLUMINATOR WAVEGUIDE N/F (MISCELLANEOUS) IMPLANT
KIT MARKER MARGIN INK (KITS) ×2 IMPLANT
LIGHT WAVEGUIDE WIDE FLAT (MISCELLANEOUS) IMPLANT
NDL HYPO 25X1 1.5 SAFETY (NEEDLE) IMPLANT
NEEDLE HYPO 25X1 1.5 SAFETY (NEEDLE) ×2 IMPLANT
NS IRRIG 1000ML POUR BTL (IV SOLUTION) ×1 IMPLANT
PACK BASIN DAY SURGERY FS (CUSTOM PROCEDURE TRAY) ×2 IMPLANT
PENCIL BUTTON HOLSTER BLD 10FT (ELECTRODE) ×2 IMPLANT
SLEEVE SCD COMPRESS KNEE MED (MISCELLANEOUS) ×2 IMPLANT
SPONGE LAP 18X18 RF (DISPOSABLE) ×2 IMPLANT
SUT MON AB 4-0 PC3 18 (SUTURE) ×1 IMPLANT
SUT SILK 2 0 SH (SUTURE) IMPLANT
SUT VICRYL 3-0 CR8 SH (SUTURE) ×2 IMPLANT
SYR CONTROL 10ML LL (SYRINGE) ×1 IMPLANT
TOWEL GREEN STERILE FF (TOWEL DISPOSABLE) ×2 IMPLANT
TOWEL OR NON WOVEN STRL DISP B (DISPOSABLE) ×1 IMPLANT
TUBE CONNECTING 20X1/4 (TUBING) ×2 IMPLANT
YANKAUER SUCT BULB TIP NO VENT (SUCTIONS) ×1 IMPLANT

## 2018-04-15 NOTE — Interval H&P Note (Signed)
History and Physical Interval Note:  04/15/2018 8:17 AM  Emma Velasquez  has presented today for surgery, with the diagnosis of LEFT BREAST SCLEROSING ADENOSIS  The various methods of treatment have been discussed with the patient and family. After consideration of risks, benefits and other options for treatment, the patient has consented to  Procedure(s): BREAST LUMPECTOMY WITH RADIOACTIVE SEED LOCALIZATION (Left) as a surgical intervention .  The patient's history has been reviewed, patient examined, no change in status, stable for surgery.  I have reviewed the patient's chart and labs.  Questions were answered to the patient's satisfaction.     TOTH III,Niani Mourer S

## 2018-04-15 NOTE — Anesthesia Postprocedure Evaluation (Signed)
Anesthesia Post Note  Patient: Emma Velasquez  Procedure(s) Performed: BREAST LUMPECTOMY WITH RADIOACTIVE SEED LOCALIZATION (Left Breast)     Patient location during evaluation: PACU Anesthesia Type: General Level of consciousness: awake and alert Pain management: pain level controlled Vital Signs Assessment: post-procedure vital signs reviewed and stable Respiratory status: spontaneous breathing, nonlabored ventilation, respiratory function stable and patient connected to nasal cannula oxygen Cardiovascular status: blood pressure returned to baseline and stable Postop Assessment: no apparent nausea or vomiting Anesthetic complications: no    Last Vitals:  Vitals:   04/15/18 1013 04/15/18 1035  BP:  125/76  Pulse: 84 83  Resp: 14 16  Temp:  36.4 C  SpO2: 97% 96%    Last Pain:  Vitals:   04/15/18 1035  TempSrc: Oral  PainSc: 0-No pain                 Kyriana Yankee P Allysen Lazo

## 2018-04-15 NOTE — Discharge Instructions (Signed)
Post Anesthesia Home Care Instructions  Activity: Get plenty of rest for the remainder of the day. A responsible individual must stay with you for 24 hours following the procedure.  For the next 24 hours, DO NOT: -Drive a car -Paediatric nurse -Drink alcoholic beverages -Take any medication unless instructed by your physician -Make any legal decisions or sign important papers.  Meals: Start with liquid foods such as gelatin or soup. Progress to regular foods as tolerated. Avoid greasy, spicy, heavy foods. If nausea and/or vomiting occur, drink only clear liquids until the nausea and/or vomiting subsides. Call your physician if vomiting continues.  Special Instructions/Symptoms: Your throat may feel dry or sore from the anesthesia or the breathing tube placed in your throat during surgery. If this causes discomfort, gargle with warm salt water. The discomfort should disappear within 24 hours.  If you had a scopolamine patch placed behind your ear for the management of post- operative nausea and/or vomiting:  1. The medication in the patch is effective for 72 hours, after which it should be removed.  Wrap patch in a tissue and discard in the trash. Wash hands thoroughly with soap and water. 2. You may remove the patch earlier than 72 hours if you experience unpleasant side effects which may include dry mouth, dizziness or visual disturbances. 3. Avoid touching the patch. Wash your hands with soap and water after contact with the patch.      **Oxycodone IR 5mg  given at 1001.

## 2018-04-15 NOTE — H&P (Signed)
Emma Velasquez  Location: [facility] Patient #: [id number] DOB: 1960/11/13 Undefined / Language: Cleophus Molt / Race: Black or African American Female   History of Present Illness  The patient is a 57 year old female who presents with a breast mass. We are asked to see the patient in consultation by Dr. Nolon Nations to evaluate her for a left breast complex sclerosing lesion. The patient is a 57 year old black female who recently went for a routine screening mammogram. At that time she was found to have an area of distortion measuring 2.3 cm in the upper inner subareolar left breast. 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 She does have a history of breast cancer in her mother at the age of 92 as well as in a maternal aunt and maternal cousin. She does not smoke. She denies any breast pain or discharge from the nipple.   Past Surgical History  Breast Biopsy  Left. Colon Polyp Removal - Colonoscopy  Oral Surgery   Diagnostic Studies History  Colonoscopy  1-5 years ago Mammogram  1-3 years ago  Allergies  No Known Drug Allergies [03/09/2018]: Allergies Reconciled   Medication History  HydroCHLOROthiazide (12.5MG  Tablet, Oral) Active. Simvastatin (20MG  Tablet, Oral) Active. Multiple Minerals-Vitamins (Oral) Active.  Social History  Alcohol use  Occasional alcohol use. Caffeine use  Coffee, Tea. Illicit drug use  Remotely quit drug use. Tobacco use  Former smoker.  Family History  Alcohol Abuse  Father. Arthritis  Mother. Breast Cancer  Mother. Hypertension  Brother, Father, Mother, Sister. Thyroid problems  Brother, Mother.  Pregnancy / Birth History  Age at menarche  61 years. Contraceptive History  Depo-provera, Oral contraceptives. Gravida  0 Irregular periods  Para  0  Other Problems  Heart murmur  Hemorrhoids  High blood pressure  Hypercholesterolemia   Thyroid Disease     Review of Systems General Present- Night Sweats and Weight Gain. Not Present- Appetite Loss, Chills, Fatigue, Fever and Weight Loss. Skin Not Present- Change in Wart/Mole, Dryness, Hives, Jaundice, New Lesions, Non-Healing Wounds, Rash and Ulcer. HEENT Present- Seasonal Allergies and Wears glasses/contact lenses. Not Present- Earache, Hearing Loss, Hoarseness, Nose Bleed, Oral Ulcers, Ringing in the Ears, Sinus Pain, Sore Throat, Visual Disturbances and Yellow Eyes. Respiratory Not Present- Bloody sputum, Chronic Cough, Difficulty Breathing, Snoring and Wheezing. Breast Not Present- Breast Mass, Breast Pain, Nipple Discharge and Skin Changes. Cardiovascular Not Present- Chest Pain, Difficulty Breathing Lying Down, Leg Cramps, Palpitations, Rapid Heart Rate, Shortness of Breath and Swelling of Extremities. Gastrointestinal Not Present- Abdominal Pain, Bloating, Bloody Stool, Change in Bowel Habits, Chronic diarrhea, Constipation, Difficulty Swallowing, Excessive gas, Gets full quickly at meals, Hemorrhoids, Indigestion, Nausea, Rectal Pain and Vomiting. Female Genitourinary Not Present- Frequency, Nocturia, Painful Urination, Pelvic Pain and Urgency. Neurological Not Present- Decreased Memory, Fainting, Headaches, Numbness, Seizures, Tingling, Tremor, Trouble walking and Weakness. Psychiatric Not Present- Anxiety, Bipolar, Change in Sleep Pattern, Depression, Fearful and Frequent crying. Endocrine Present- Hot flashes. Not Present- Cold Intolerance, Excessive Hunger, Hair Changes, Heat Intolerance and New Diabetes. Hematology Not Present- Blood Thinners, Easy Bruising, Excessive bleeding, Gland problems, HIV and Persistent Infections.  Vitals  Weight: 179.38 lb Height: 64in Body Surface Area: 1.87 m Body Mass Index: 30.79 kg/m  Temp.: 98.28F(Oral)  Pulse: 78 (Regular)  Resp.: 18 (Unlabored)  P.OX: 99% (Room air)       Physical Exam  General Mental  Status-Alert. General Appearance-Consistent with stated age. Hydration-Well hydrated. Voice-Normal.  Head  and Neck Head-normocephalic, atraumatic with no lesions or palpable masses. Trachea-midline. Thyroid Gland Characteristics - normal size and consistency.  Eye Eyeball - Bilateral-Extraocular movements intact. Sclera/Conjunctiva - Bilateral-No scleral icterus.  Chest and Lung Exam Chest and lung exam reveals -quiet, even and easy respiratory effort with no use of accessory muscles and on auscultation, normal breath sounds, no adventitious sounds and normal vocal resonance. Inspection Chest Wall - Normal. Back - normal.  Breast Note: There is no palpable mass in either breast. There is no palpable axillary, supraclavicular, or cervical lymphadenopathy.   Cardiovascular Cardiovascular examination reveals -normal heart sounds, regular rate and rhythm with no murmurs and normal pedal pulses bilaterally.  Abdomen Inspection Inspection of the abdomen reveals - No Hernias. Skin - Scar - no surgical scars. Palpation/Percussion Palpation and Percussion of the abdomen reveal - Soft, Non Tender, No Rebound tenderness, No Rigidity (guarding) and No hepatosplenomegaly. Auscultation Auscultation of the abdomen reveals - Bowel sounds normal.  Neurologic Neurologic evaluation reveals -alert and oriented x 3 with no impairment of recent or remote memory. Mental Status-Normal.  Musculoskeletal Normal Exam - Left-Upper Extremity Strength Normal and Lower Extremity Strength Normal. Normal Exam - Right-Upper Extremity Strength Normal and Lower Extremity Strength Normal.  Lymphatic Head & Neck  General Head & Neck Lymphatics: Bilateral - Description - Normal. Axillary  General Axillary Region: Bilateral - Description - Normal. Tenderness - Non Tender. Femoral & Inguinal  Generalized Femoral & Inguinal Lymphatics: Bilateral - Description - Normal.  Tenderness - Non Tender.    Assessment & Plan  SCLEROSING ADENOSIS OF BREAST, LEFT (N60.22) Impression: The patient appears to have an area of complex sclerosing lesion and pseudo-angiomatous stromal hyperplasia in the upper inner central left breast that measures about 2.3 cm. Because of the size of the area involved there is about a 5-10% chance of missing something more significant. Because of this I think it would be reasonable to remove this area especially given her family history of breast cancer. She is in agreement with this. I have discussed with her in detail the risks and benefits of the operation as well as some of the technical aspects and she understands and wishes to proceed. I will plan for a left breast radioactive seed localized lumpectomy

## 2018-04-15 NOTE — Anesthesia Procedure Notes (Signed)
Procedure Name: LMA Insertion Date/Time: 04/15/2018 8:35 AM Performed by: Signe Colt, CRNA Pre-anesthesia Checklist: Patient identified, Emergency Drugs available, Suction available and Patient being monitored Patient Re-evaluated:Patient Re-evaluated prior to induction Oxygen Delivery Method: Circle system utilized Preoxygenation: Pre-oxygenation with 100% oxygen Induction Type: IV induction Ventilation: Mask ventilation without difficulty LMA: LMA inserted LMA Size: 4.0 Number of attempts: 1 Airway Equipment and Method: Bite block Placement Confirmation: positive ETCO2 Tube secured with: Tape Dental Injury: Teeth and Oropharynx as per pre-operative assessment

## 2018-04-15 NOTE — Op Note (Signed)
04/15/2018  9:23 AM  PATIENT:  Emma Velasquez  57 y.o. female  PRE-OPERATIVE DIAGNOSIS:  LEFT BREAST SCLEROSING ADENOSIS  POST-OPERATIVE DIAGNOSIS:  LEFT BREAST SCLEROSING ADENOSIS  PROCEDURE:  Procedure(s): BREAST LUMPECTOMY WITH RADIOACTIVE SEED LOCALIZATION (Left)  SURGEON:  Surgeon(s) and Role:    Jovita Kussmaul, MD - Primary  PHYSICIAN ASSISTANT:   ASSISTANTS: none   ANESTHESIA:   local and general  EBL:  minimal   BLOOD ADMINISTERED:none  DRAINS: none   LOCAL MEDICATIONS USED:  MARCAINE     SPECIMEN:  Source of Specimen:  left breast tissue  DISPOSITION OF SPECIMEN:  PATHOLOGY  COUNTS:  YES  TOURNIQUET:  * No tourniquets in log *  DICTATION: .Dragon Dictation   After informed consent was obtained the patient was brought to the operating room and placed in the supine position on the operating table.  After adequate induction of general anesthesia the patient's left breast was prepped with ChloraPrep, allowed to dry, and draped in usual sterile manner.  An appropriate timeout was performed.  Previously an I-125 seed was placed in the upper central left breast to mark an area of a complex sclerosing lesion.  The neoprobe was set to I-125 in the area of radioactivity was readily identified.  The area around this was infiltrated with quarter percent Marcaine.  A curvilinear incision was made along the upper edge of the areole of the left breast with a 15 blade knife.  The incision was carried through the skin and subcutaneous tissue sharply with the electrocautery.  Dissection was then carried towards the radioactive seed under the direction of the neoprobe.  Once I more closely approached the radioactive seed I then removed a circular portion of breast tissue sharply with the electrocautery around the radioactive seed while checking the area of radioactivity frequently.  Once the specimen was removed it was oriented with the appropriate paint colors.  A specimen radiograph  was obtained that showed the seed to be within the specimen.  The clip was not identified.  An additional superior margin was then removed sharply with the electrocautery and a specimen radiograph of this showed the clip.  The 2 specimens were then sent to pathology for further evaluation.  Hemostasis was achieved using the Bovie electrocautery.  The wound was irrigated with copious amounts of saline.  The deep layer of the wound was then closed with layers of interrupted 3-0 Vicryl stitches.  The skin was then closed with interrupted 4-0 Monocryl subcuticular stitches.  Dermabond dressings were applied.  The patient tolerated the procedure well.  At the end of the case all needle sponge and instrument counts were correct.  The patient was then awakened and taken to recovery in stable condition.  PLAN OF CARE: Discharge to home after PACU  PATIENT DISPOSITION:  PACU - hemodynamically stable.   Delay start of Pharmacological VTE agent (>24hrs) due to surgical blood loss or risk of bleeding: not applicable

## 2018-04-15 NOTE — Transfer of Care (Signed)
Immediate Anesthesia Transfer of Care Note  Patient: Emma Velasquez  Procedure(s) Performed: BREAST LUMPECTOMY WITH RADIOACTIVE SEED LOCALIZATION (Left Breast)  Patient Location: PACU  Anesthesia Type:General  Level of Consciousness: awake and patient cooperative  Airway & Oxygen Therapy: Patient Spontanous Breathing and Patient connected to face mask oxygen  Post-op Assessment: Report given to RN and Post -op Vital signs reviewed and stable  Post vital signs: Reviewed and stable  Last Vitals:  Vitals Value Taken Time  BP    Temp    Pulse 98 04/15/2018  9:29 AM  Resp 16 04/15/2018  9:29 AM  SpO2 100 % 04/15/2018  9:29 AM  Vitals shown include unvalidated device data.  Last Pain:  Vitals:   04/15/18 0727  TempSrc: Oral  PainSc: 1          Complications: No apparent anesthesia complications

## 2018-04-16 ENCOUNTER — Encounter (HOSPITAL_BASED_OUTPATIENT_CLINIC_OR_DEPARTMENT_OTHER): Payer: Self-pay | Admitting: General Surgery

## 2019-07-10 IMAGING — MG MM PLC BREAST LOC DEV 1ST LESION INC MAMMO GUIDE*L*
8 of 10 series · 8 of 10 positions shown · non-contrast
Comparison: Previous exam(s).

CLINICAL DATA: Radioactive seed localization

EXAM:
MAMMOGRAPHIC GUIDED RADIOACTIVE SEED LOCALIZATION OF THE LEFT BREAST

[L CC (1 of 3)]
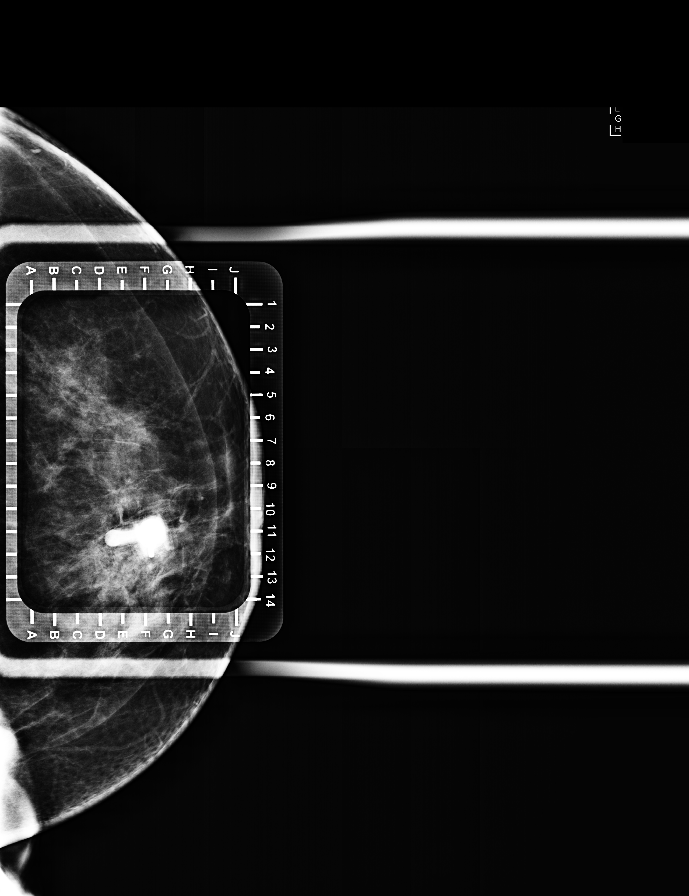

[L CC (2 of 3)]
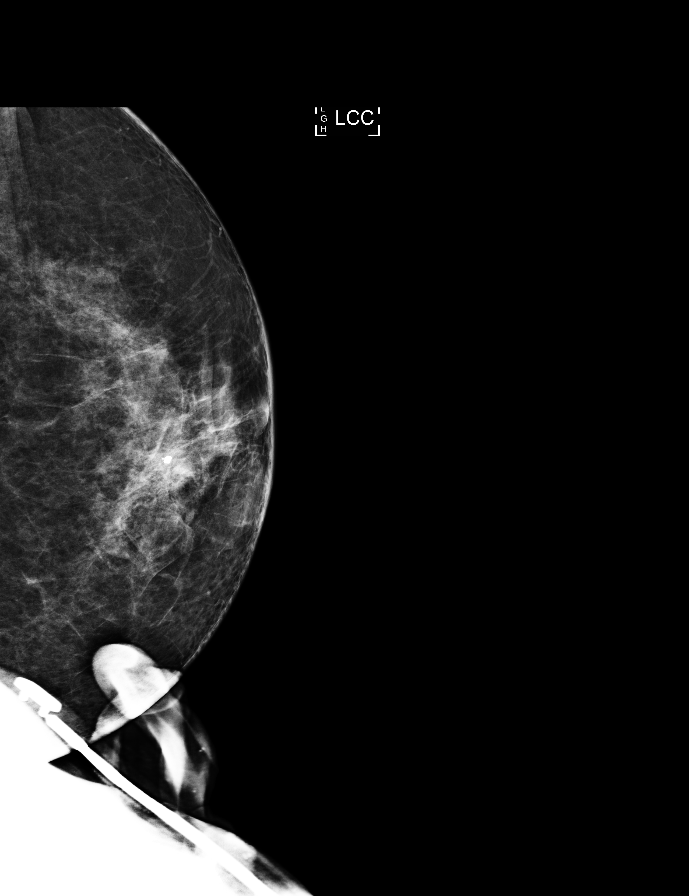

[L ML (1 of 5)]
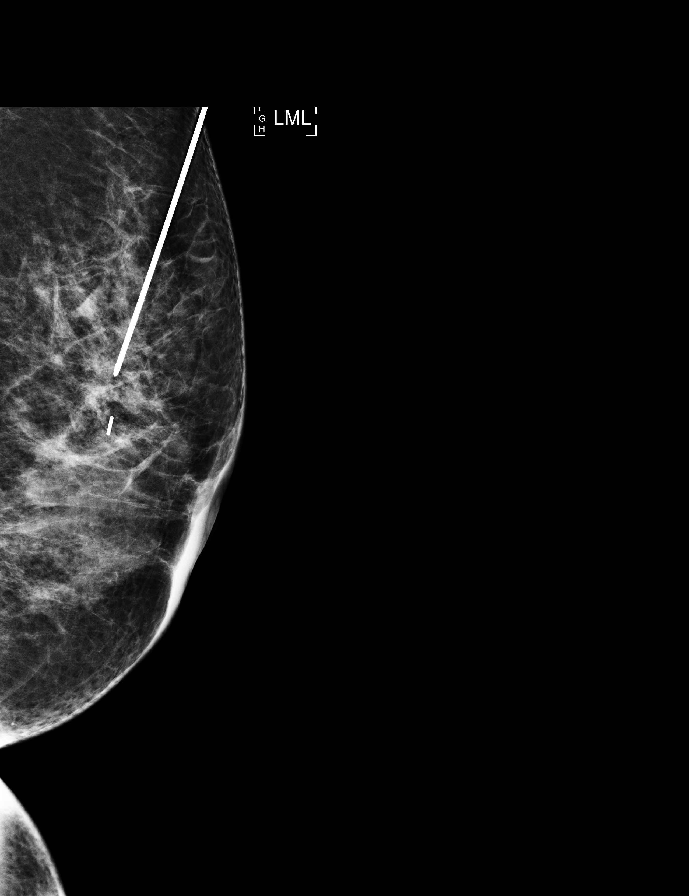

[L ML (2 of 5)]
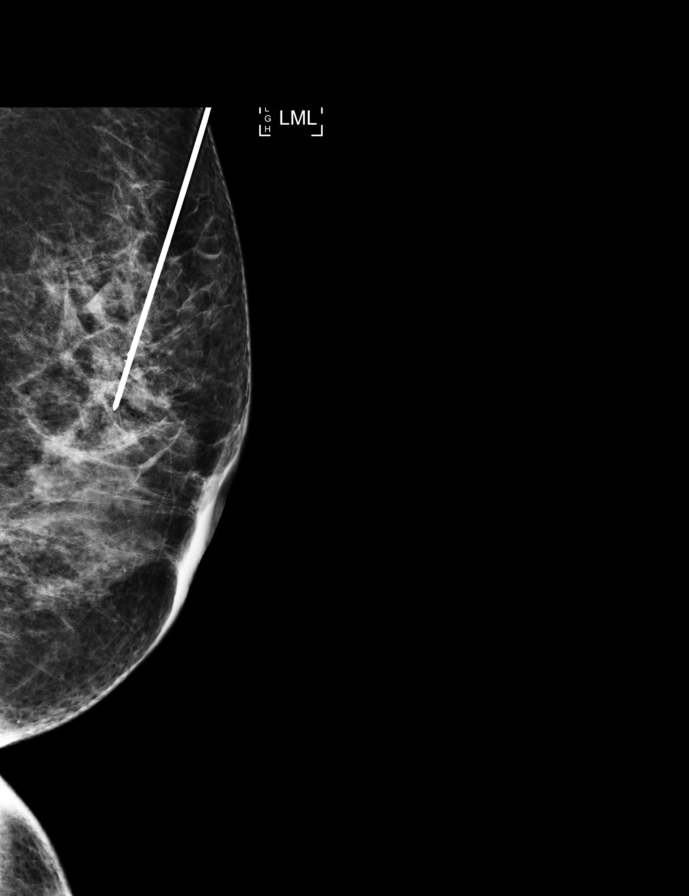

[L ML (3 of 5)]
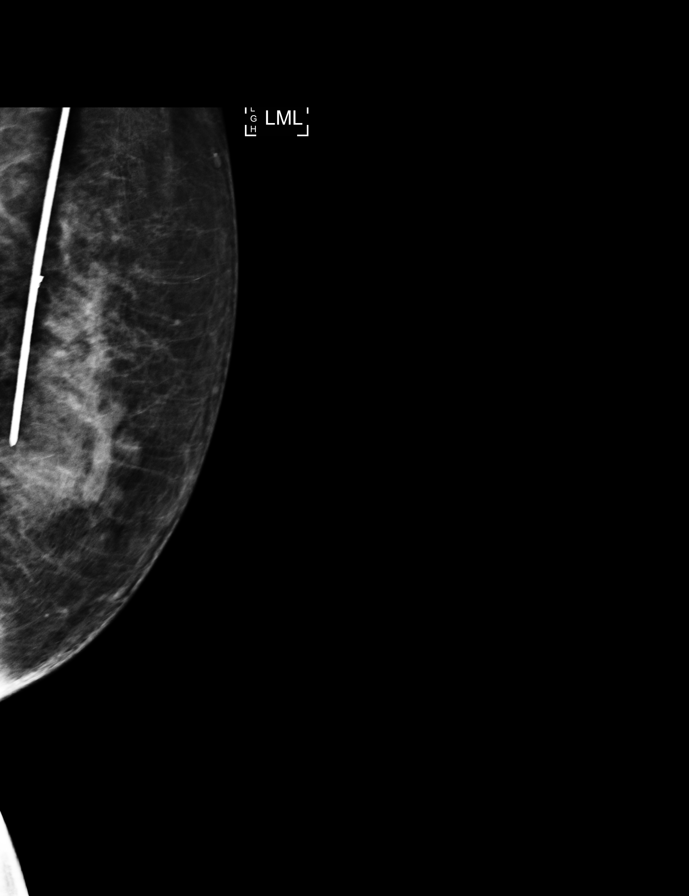

[L CC (3 of 3)]
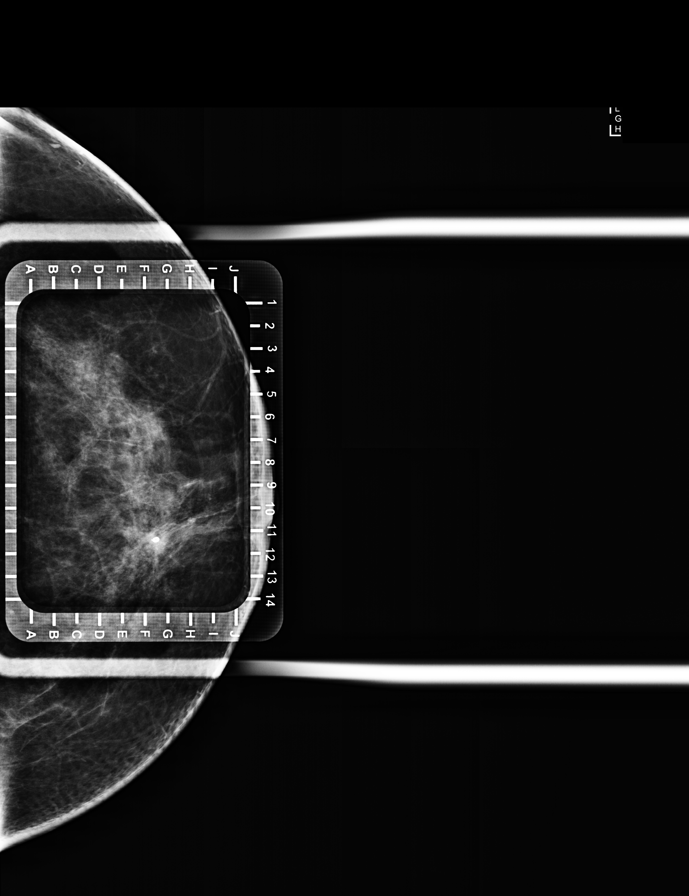

[L ML (4 of 5)]
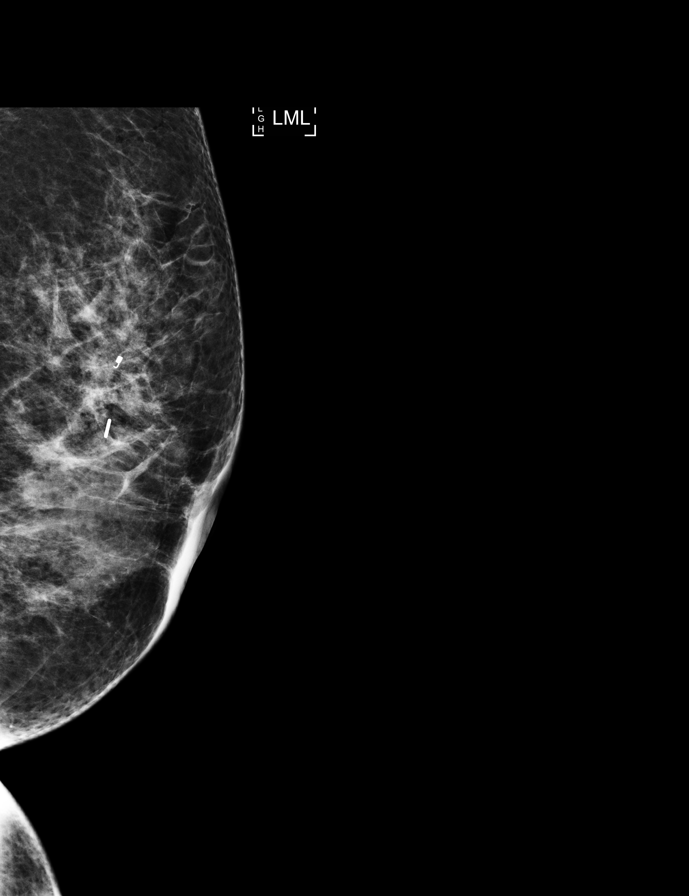

[L ML (5 of 5)]
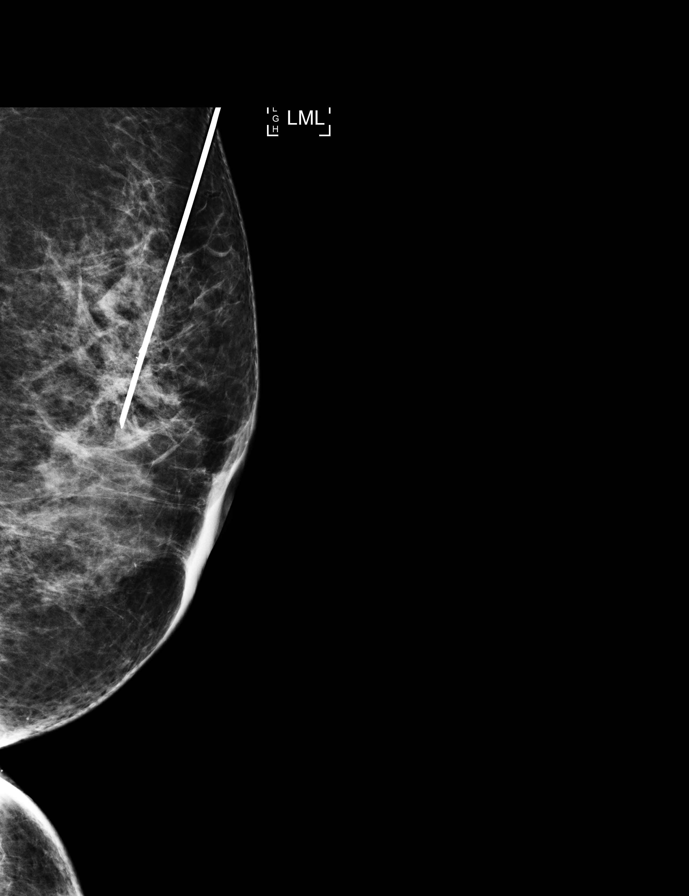

[8 of 10 positions shown; findings below may reference images not displayed]



The usual time-out protocol was performed immediately prior to the
procedure.

Using mammographic guidance, sterile technique, 1% lidocaine and an
[id number]-[id number] radioactive seed, the region of distortion, 1.5 cm inferior to
the biopsy clip was localized using a superior approach. The
follow-up mammogram images confirm the seed in the expected location
and were marked for the surgeon.

Follow-up survey of the patient confirms presence of the radioactive
seed.

Order number of [id number] seed:  [id number].

Total activity:  0.258 millicuries reference Date: March 19, 2018

The patient tolerated the procedure well and was released from the
[REDACTED]. She was given instructions regarding seed removal.
IMPRESSION: Radioactive seed localization in the left breast breast. No apparent
complications.

It is important to note that the biopsy clip is 1.5 cm superior to
the distortion. The radioactive seed is along the inferior aspect of
the distortion.

## 2019-11-01 ENCOUNTER — Ambulatory Visit: Payer: 59 | Attending: Internal Medicine

## 2019-11-01 DIAGNOSIS — Z23 Encounter for immunization: Secondary | ICD-10-CM | POA: Insufficient documentation

## 2019-11-01 NOTE — Progress Notes (Signed)
   Covid-19 Vaccination Clinic  Name:  VIONE EVERTON    MRN: [id number] DOB: [birth year]  11/01/2019  Ms. Greenhaw was observed post Covid-19 immunization for 15 minutes without incidence. She was provided with Vaccine Information Sheet and instruction to access the V-Safe system.   Ms. Beighley was instructed to call 911 with any severe reactions post vaccine: Marland Kitchen Difficulty breathing  . Swelling of your face and throat  . A fast heartbeat  . A bad rash all over your body  . Dizziness and weakness    Immunizations Administered    Name Date Dose VIS Date Route   Pfizer COVID-19 Vaccine 11/01/2019 10:46 AM 0.3 mL 08/13/2019 Intramuscular   Manufacturer: [place]   Lot: [id number]   [place]: [id number]

## 2019-11-24 ENCOUNTER — Ambulatory Visit: Payer: 59 | Attending: Internal Medicine

## 2019-11-24 DIAGNOSIS — Z23 Encounter for immunization: Secondary | ICD-10-CM

## 2019-11-24 NOTE — Progress Notes (Signed)
   Covid-19 Vaccination Clinic  Name:  Emma Velasquez    MRN: [id number] DOB: 1961-05-09  11/24/2019  Emma Velasquez was observed post Covid-19 immunization for 15 minutes without incident. She was provided with Vaccine Information Sheet and instruction to access the V-Safe system.   Emma Velasquez was instructed to call 911 with any severe reactions post vaccine: Marland Kitchen Difficulty breathing  . Swelling of face and throat  . A fast heartbeat  . A bad rash all over body  . Dizziness and weakness   Immunizations Administered    Name Date Dose VIS Date Route   Pfizer COVID-19 Vaccine 11/24/2019 10:23 AM 0.3 mL 08/13/2019 Intramuscular   Manufacturer: [place]   Lot: [id number]   [place]: [id number]

## 2020-03-14 ENCOUNTER — Other Ambulatory Visit: Payer: Self-pay | Admitting: Family Medicine

## 2020-03-14 ENCOUNTER — Other Ambulatory Visit: Payer: Self-pay | Admitting: Obstetrics and Gynecology

## 2020-03-14 DIAGNOSIS — Z1231 Encounter for screening mammogram for malignant neoplasm of breast: Secondary | ICD-10-CM

## 2020-03-28 ENCOUNTER — Other Ambulatory Visit: Payer: Self-pay

## 2020-03-28 ENCOUNTER — Ambulatory Visit
Admission: RE | Admit: 2020-03-28 | Discharge: 2020-03-28 | Disposition: A | Discharge status: Home / Self Care | Payer: 59 | Source: Ambulatory Visit | Attending: Family Medicine | Admitting: Family Medicine

## 2020-03-28 DIAGNOSIS — Z1231 Encounter for screening mammogram for malignant neoplasm of breast: Secondary | ICD-10-CM

## 2020-05-25 DIAGNOSIS — R7303 Prediabetes: Secondary | ICD-10-CM | POA: Insufficient documentation

## 2020-06-02 ENCOUNTER — Ambulatory Visit (INDEPENDENT_AMBULATORY_CARE_PROVIDER_SITE_OTHER): Payer: No Typology Code available for payment source | Admitting: Podiatry

## 2020-06-02 ENCOUNTER — Other Ambulatory Visit: Payer: Self-pay

## 2020-06-02 ENCOUNTER — Ambulatory Visit (INDEPENDENT_AMBULATORY_CARE_PROVIDER_SITE_OTHER): Payer: No Typology Code available for payment source

## 2020-06-02 DIAGNOSIS — M795 Residual foreign body in soft tissue: Secondary | ICD-10-CM | POA: Diagnosis not present

## 2020-06-02 DIAGNOSIS — S91332A Puncture wound without foreign body, left foot, initial encounter: Secondary | ICD-10-CM | POA: Diagnosis not present

## 2020-06-02 DIAGNOSIS — M79671 Pain in right foot: Secondary | ICD-10-CM | POA: Diagnosis not present

## 2020-06-02 DIAGNOSIS — N760 Acute vaginitis: Secondary | ICD-10-CM | POA: Insufficient documentation

## 2020-06-02 DIAGNOSIS — M25871 Other specified joint disorders, right ankle and foot: Secondary | ICD-10-CM

## 2020-06-02 DIAGNOSIS — D259 Leiomyoma of uterus, unspecified: Secondary | ICD-10-CM | POA: Insufficient documentation

## 2020-06-02 DIAGNOSIS — I10 Essential (primary) hypertension: Secondary | ICD-10-CM | POA: Insufficient documentation

## 2020-06-02 MED ORDER — MELOXICAM 15 MG PO TABS: 15.0000 mg | ORAL_TABLET | Freq: Every day | ORAL | 0 refills | Status: AC

## 2020-06-02 NOTE — Progress Notes (Signed)
  Subjective:  Patient ID: Emma Velasquez, female    DOB: 1961/07/06,  MRN: [id number]  Chief Complaint  Patient presents with  . Foreign Body    Right foot: Pt states "I think I stepped on something" 2-3 month duration, generalized plantar foot pain primarily plantar heel.  . Foot Pain    Left plantar heel pain occasionally.    59 y.o. female presents with the above complaint. History confirmed with patient.   Objective:  Physical Exam: warm, good capillary refill, no trophic changes or ulcerative lesions, normal DP and PT pulses and normal sensory exam. Left Foot: small possible puncture area left 4th met plantar Right Foot: POP sesamoid complex. No pain at hte hallux IPJ but IPJ callus noted  No images are attached to the encounter.  Radiographs: taken and reviewed no acute fractures or dislocations no degenerative changes, Hallux IPJ, 2nd/5th met sesamoid bones Assessment:   1. Sesamoiditis of right foot   2. Right foot pain    Plan:  Patient was evaluated and treated and all questions answered.  Sesamoiditis Right -No evidence FB -Educated on etiology -XR reviewed with patient -Offloading pad applied  Left foot ?Puncture wound -Found small area of possible puncture, patient unaware of it, had a little pain at the area. The area was gently debrided and no FB was noted.  No follow-ups on file.

## 2020-07-14 ENCOUNTER — Ambulatory Visit: Payer: No Typology Code available for payment source | Admitting: Podiatry

## 2020-08-15 ENCOUNTER — Other Ambulatory Visit: Payer: Self-pay

## 2020-08-15 ENCOUNTER — Ambulatory Visit (INDEPENDENT_AMBULATORY_CARE_PROVIDER_SITE_OTHER): Payer: No Typology Code available for payment source | Admitting: Podiatry

## 2020-08-15 DIAGNOSIS — M25871 Other specified joint disorders, right ankle and foot: Secondary | ICD-10-CM | POA: Diagnosis not present

## 2020-08-15 DIAGNOSIS — M7741 Metatarsalgia, right foot: Secondary | ICD-10-CM

## 2020-08-15 DIAGNOSIS — M79671 Pain in right foot: Secondary | ICD-10-CM

## 2020-08-15 NOTE — Progress Notes (Signed)
  Subjective:  Patient ID: KEA CALLAN, female    DOB: 1961-03-08,  MRN: [id number]  Chief Complaint  Patient presents with  . Foot Pain    Pt states right plantar forefoot discomfort which is mild and occasional. Pt states interested in otc inserts/shoe recommendations.    59 y.o. female presents with the above complaint. History confirmed with patient.   Objective:  Physical Exam: warm, good capillary refill, no trophic changes or ulcerative lesions, normal DP and PT pulses and normal sensory exam. Left Foot: small possible puncture area left 4th met plantar Right Foot: POP sesamoid complex. No pain at hte hallux IPJ but IPJ callus noted  No images are attached to the encounter.  06/02/20 Radiographs: taken and reviewed no acute fractures or dislocations no degenerative changes, Hallux IPJ, 2nd/5th met sesamoid bones Assessment:   1. Sesamoiditis of right foot   2. Right foot pain   3. Metatarsalgia, right foot    Plan:  Patient was evaluated and treated and all questions answered.  Sesamoiditis Right -Educated on proper shogear, OTC orthotics. Offered Powersteps, declined. I think with better shoes her pain will do better. If not we could consider CMOs.   No follow-ups on file.

## 2021-04-03 ENCOUNTER — Other Ambulatory Visit: Payer: Self-pay | Admitting: Family Medicine

## 2021-04-03 DIAGNOSIS — Z1231 Encounter for screening mammogram for malignant neoplasm of breast: Secondary | ICD-10-CM

## 2021-05-24 ENCOUNTER — Other Ambulatory Visit: Payer: Self-pay

## 2021-05-24 ENCOUNTER — Ambulatory Visit
Admission: RE | Admit: 2021-05-24 | Discharge: 2021-05-24 | Disposition: A | Discharge status: Home / Self Care | Payer: No Typology Code available for payment source | Source: Ambulatory Visit | Attending: Family Medicine | Admitting: Family Medicine

## 2021-05-24 DIAGNOSIS — Z1231 Encounter for screening mammogram for malignant neoplasm of breast: Secondary | ICD-10-CM

## 2022-06-27 ENCOUNTER — Other Ambulatory Visit: Payer: Self-pay | Admitting: Family Medicine

## 2022-06-27 DIAGNOSIS — Z1231 Encounter for screening mammogram for malignant neoplasm of breast: Secondary | ICD-10-CM

## 2022-07-12 ENCOUNTER — Ambulatory Visit
Admission: RE | Admit: 2022-07-12 | Discharge: 2022-07-12 | Disposition: A | Discharge status: Home / Self Care | Payer: No Typology Code available for payment source | Source: Ambulatory Visit | Attending: Family Medicine | Admitting: Family Medicine

## 2022-07-12 DIAGNOSIS — Z1231 Encounter for screening mammogram for malignant neoplasm of breast: Secondary | ICD-10-CM

## 2023-08-07 ENCOUNTER — Other Ambulatory Visit: Payer: Self-pay | Admitting: Obstetrics and Gynecology

## 2023-08-07 DIAGNOSIS — R928 Other abnormal and inconclusive findings on diagnostic imaging of breast: Secondary | ICD-10-CM

## 2023-08-20 ENCOUNTER — Ambulatory Visit: Payer: No Typology Code available for payment source

## 2023-08-20 ENCOUNTER — Ambulatory Visit
Admission: RE | Admit: 2023-08-20 | Discharge: 2023-08-20 | Disposition: A | Discharge status: Home / Self Care | Payer: No Typology Code available for payment source | Source: Ambulatory Visit | Attending: Obstetrics and Gynecology | Admitting: Obstetrics and Gynecology

## 2023-08-20 DIAGNOSIS — R928 Other abnormal and inconclusive findings on diagnostic imaging of breast: Secondary | ICD-10-CM

## 2024-05-21 ENCOUNTER — Other Ambulatory Visit: Payer: Self-pay | Admitting: Obstetrics and Gynecology

## 2024-05-21 DIAGNOSIS — Z1231 Encounter for screening mammogram for malignant neoplasm of breast: Secondary | ICD-10-CM

## 2024-07-02 ENCOUNTER — Ambulatory Visit
Admission: RE | Admit: 2024-07-02 | Discharge: 2024-07-02 | Disposition: A | Discharge status: Home / Self Care | Source: Ambulatory Visit | Attending: Obstetrics and Gynecology | Admitting: Obstetrics and Gynecology

## 2024-07-02 DIAGNOSIS — Z1231 Encounter for screening mammogram for malignant neoplasm of breast: Secondary | ICD-10-CM

## 2024-07-23 ENCOUNTER — Ambulatory Visit

## 2024-07-25 ENCOUNTER — Other Ambulatory Visit (HOSPITAL_BASED_OUTPATIENT_CLINIC_OR_DEPARTMENT_OTHER): Payer: Self-pay | Admitting: Obstetrics and Gynecology

## 2024-07-25 DIAGNOSIS — Z13820 Encounter for screening for osteoporosis: Secondary | ICD-10-CM

## 2024-08-03 ENCOUNTER — Ambulatory Visit
Admission: RE | Admit: 2024-08-03 | Discharge: 2024-08-03 | Disposition: A | Source: Ambulatory Visit | Attending: Obstetrics and Gynecology | Admitting: Obstetrics and Gynecology
